# Patient Record
Sex: Female | Born: 1945
Health system: Southern US, Community
[De-identification: ages and names within clinical notes are randomized; demographics above are authoritative.]

---

## 2013-03-23 ENCOUNTER — Emergency Department: Payer: Self-pay | Admitting: Emergency Medicine

## 2013-03-31 ENCOUNTER — Inpatient Hospital Stay: Payer: Self-pay | Admitting: Vascular Surgery

## 2013-03-31 LAB — CBC
HCT: 40.2 % (ref 35.0–47.0)
HGB: 13.6 g/dL (ref 12.0–16.0)
MCH: 29.4 pg (ref 26.0–34.0)
MCHC: 33.9 g/dL (ref 32.0–36.0)
MCV: 87 fL (ref 80–100)
PLATELETS: 208 10*3/uL (ref 150–440)
RBC: 4.63 10*6/uL (ref 3.80–5.20)
RDW: 13.5 % (ref 11.5–14.5)
WBC: 10.1 10*3/uL (ref 3.6–11.0)

## 2013-03-31 LAB — COMPREHENSIVE METABOLIC PANEL
ALBUMIN: 3.5 g/dL (ref 3.4–5.0)
ALT: 20 U/L (ref 12–78)
Alkaline Phosphatase: 74 U/L
Anion Gap: 10 (ref 7–16)
BUN: 26 mg/dL — AB (ref 7–18)
Bilirubin,Total: 0.4 mg/dL (ref 0.2–1.0)
CALCIUM: 9.1 mg/dL (ref 8.5–10.1)
CHLORIDE: 100 mmol/L (ref 98–107)
CO2: 26 mmol/L (ref 21–32)
Creatinine: 1.45 mg/dL — ABNORMAL HIGH (ref 0.60–1.30)
EGFR (African American): 42 — ABNORMAL LOW
GFR CALC NON AF AMER: 37 — AB
Glucose: 61 mg/dL — ABNORMAL LOW (ref 65–99)
OSMOLALITY: 275 (ref 275–301)
Potassium: 2.8 mmol/L — ABNORMAL LOW (ref 3.5–5.1)
SGOT(AST): 23 U/L (ref 15–37)
SODIUM: 136 mmol/L (ref 136–145)
Total Protein: 7.4 g/dL (ref 6.4–8.2)

## 2013-03-31 LAB — FIBRINOGEN: Fibrinogen: 294 mg/dL (ref 210–470)

## 2013-03-31 LAB — APTT: ACTIVATED PTT: 27.1 s (ref 23.6–35.9)

## 2013-03-31 LAB — PROTIME-INR
INR: 0.9
PROTHROMBIN TIME: 12.3 s (ref 11.5–14.7)

## 2013-03-31 LAB — TROPONIN I

## 2013-04-01 LAB — BASIC METABOLIC PANEL
Anion Gap: 6 — ABNORMAL LOW (ref 7–16)
BUN: 21 mg/dL — ABNORMAL HIGH (ref 7–18)
CHLORIDE: 101 mmol/L (ref 98–107)
Calcium, Total: 8 mg/dL — ABNORMAL LOW (ref 8.5–10.1)
Co2: 27 mmol/L (ref 21–32)
Creatinine: 1.2 mg/dL (ref 0.60–1.30)
EGFR (African American): 53 — ABNORMAL LOW
GFR CALC NON AF AMER: 46 — AB
Glucose: 323 mg/dL — ABNORMAL HIGH (ref 65–99)
Osmolality: 284 (ref 275–301)
POTASSIUM: 3.3 mmol/L — AB (ref 3.5–5.1)
SODIUM: 134 mmol/L — AB (ref 136–145)

## 2013-04-01 LAB — CBC WITH DIFFERENTIAL/PLATELET
BASOS ABS: 0.1 10*3/uL (ref 0.0–0.1)
BASOS PCT: 0.8 %
BASOS PCT: 1 %
Basophil #: 0.1 10*3/uL (ref 0.0–0.1)
EOS PCT: 1.4 %
EOS PCT: 1.7 %
Eosinophil #: 0.1 10*3/uL (ref 0.0–0.7)
Eosinophil #: 0.2 10*3/uL (ref 0.0–0.7)
HCT: 38.1 % (ref 35.0–47.0)
HCT: 38.3 % (ref 35.0–47.0)
HGB: 13 g/dL (ref 12.0–16.0)
HGB: 13.1 g/dL (ref 12.0–16.0)
Lymphocyte #: 1.3 10*3/uL (ref 1.0–3.6)
Lymphocyte #: 1.7 10*3/uL (ref 1.0–3.6)
Lymphocyte %: 12.2 %
Lymphocyte %: 17.6 %
MCH: 29.8 pg (ref 26.0–34.0)
MCH: 30.1 pg (ref 26.0–34.0)
MCHC: 34.2 g/dL (ref 32.0–36.0)
MCHC: 34.2 g/dL (ref 32.0–36.0)
MCV: 87 fL (ref 80–100)
MCV: 88 fL (ref 80–100)
MONOS PCT: 9.8 %
Monocyte #: 0.9 x10 3/mm (ref 0.2–0.9)
Monocyte #: 1 x10 3/mm — ABNORMAL HIGH (ref 0.2–0.9)
Monocyte %: 9.7 %
NEUTROS ABS: 6.6 10*3/uL — AB (ref 1.4–6.5)
NEUTROS PCT: 69.9 %
NEUTROS PCT: 75.9 %
Neutrophil #: 8 10*3/uL — ABNORMAL HIGH (ref 1.4–6.5)
PLATELETS: 124 10*3/uL — AB (ref 150–440)
Platelet: 146 10*3/uL — ABNORMAL LOW (ref 150–440)
RBC: 4.33 10*6/uL (ref 3.80–5.20)
RBC: 4.4 10*6/uL (ref 3.80–5.20)
RDW: 13.2 % (ref 11.5–14.5)
RDW: 13.6 % (ref 11.5–14.5)
WBC: 10.5 10*3/uL (ref 3.6–11.0)
WBC: 9.4 10*3/uL (ref 3.6–11.0)

## 2013-04-01 LAB — APTT
ACTIVATED PTT: 55.6 s — AB (ref 23.6–35.9)
ACTIVATED PTT: 70.8 s — AB (ref 23.6–35.9)

## 2013-04-01 LAB — PROTIME-INR
INR: 1.3
Prothrombin Time: 15.8 secs — ABNORMAL HIGH (ref 11.5–14.7)

## 2013-04-02 LAB — APTT
Activated PTT: 52.8 secs — ABNORMAL HIGH (ref 23.6–35.9)
Activated PTT: 60.5 secs — ABNORMAL HIGH (ref 23.6–35.9)

## 2013-04-02 LAB — BASIC METABOLIC PANEL
Anion Gap: 6 — ABNORMAL LOW (ref 7–16)
BUN: 12 mg/dL (ref 7–18)
CHLORIDE: 103 mmol/L (ref 98–107)
CREATININE: 1.17 mg/dL (ref 0.60–1.30)
Calcium, Total: 7.6 mg/dL — ABNORMAL LOW (ref 8.5–10.1)
Co2: 28 mmol/L (ref 21–32)
EGFR (African American): 55 — ABNORMAL LOW
EGFR (Non-African Amer.): 48 — ABNORMAL LOW
Glucose: 212 mg/dL — ABNORMAL HIGH (ref 65–99)
OSMOLALITY: 280 (ref 275–301)
POTASSIUM: 3.7 mmol/L (ref 3.5–5.1)
Sodium: 137 mmol/L (ref 136–145)

## 2013-04-02 LAB — CBC WITH DIFFERENTIAL/PLATELET
Basophil #: 0.1 10*3/uL (ref 0.0–0.1)
Basophil %: 0.8 %
EOS ABS: 0.4 10*3/uL (ref 0.0–0.7)
Eosinophil %: 4.1 %
HCT: 35.3 % (ref 35.0–47.0)
HGB: 12 g/dL (ref 12.0–16.0)
LYMPHS ABS: 2 10*3/uL (ref 1.0–3.6)
LYMPHS PCT: 21.5 %
MCH: 29.7 pg (ref 26.0–34.0)
MCHC: 33.9 g/dL (ref 32.0–36.0)
MCV: 88 fL (ref 80–100)
Monocyte #: 1.1 x10 3/mm — ABNORMAL HIGH (ref 0.2–0.9)
Monocyte %: 12.2 %
NEUTROS ABS: 5.7 10*3/uL (ref 1.4–6.5)
Neutrophil %: 61.4 %
PLATELETS: 110 10*3/uL — AB (ref 150–440)
RBC: 4.03 10*6/uL (ref 3.80–5.20)
RDW: 13.6 % (ref 11.5–14.5)
WBC: 9.4 10*3/uL (ref 3.6–11.0)

## 2013-04-02 LAB — MAGNESIUM: Magnesium: 1.1 mg/dL — ABNORMAL LOW

## 2013-04-19 ENCOUNTER — Inpatient Hospital Stay: Payer: Self-pay | Admitting: Internal Medicine

## 2013-04-19 LAB — HEPATIC FUNCTION PANEL A (ARMC)
ALK PHOS: 77 U/L
ALT: 21 U/L (ref 12–78)
AST: 24 U/L (ref 15–37)
Albumin: 3.9 g/dL (ref 3.4–5.0)
BILIRUBIN TOTAL: 0.4 mg/dL (ref 0.2–1.0)
Bilirubin, Direct: <0.1 mg/dL (ref 0.00–0.20)
Total Protein: 7.6 g/dL (ref 6.4–8.2)

## 2013-04-19 LAB — BASIC METABOLIC PANEL
ANION GAP: 9 (ref 7–16)
BUN: 36 mg/dL — AB (ref 7–18)
CALCIUM: 9.1 mg/dL (ref 8.5–10.1)
CHLORIDE: 98 mmol/L (ref 98–107)
CO2: 25 mmol/L (ref 21–32)
CREATININE: 1.96 mg/dL — AB (ref 0.60–1.30)
EGFR (African American): 30 — ABNORMAL LOW
EGFR (Non-African Amer.): 25 — ABNORMAL LOW
GLUCOSE: 249 mg/dL — AB (ref 65–99)
OSMOLALITY: 281 (ref 275–301)
POTASSIUM: 3.6 mmol/L (ref 3.5–5.1)
Sodium: 132 mmol/L — ABNORMAL LOW (ref 136–145)

## 2013-04-19 LAB — CBC WITH DIFFERENTIAL/PLATELET
BASOS ABS: 0 10*3/uL (ref 0.0–0.1)
BASOS PCT: 0.5 %
EOS ABS: 0.3 10*3/uL (ref 0.0–0.7)
EOS PCT: 3.2 %
HCT: 41.7 % (ref 35.0–47.0)
HGB: 13.7 g/dL (ref 12.0–16.0)
Lymphocyte #: 2.9 10*3/uL (ref 1.0–3.6)
Lymphocyte %: 29.6 %
MCH: 29.1 pg (ref 26.0–34.0)
MCHC: 32.9 g/dL (ref 32.0–36.0)
MCV: 88 fL (ref 80–100)
MONO ABS: 0.8 x10 3/mm (ref 0.2–0.9)
Monocyte %: 8.6 %
NEUTROS ABS: 5.8 10*3/uL (ref 1.4–6.5)
NEUTROS PCT: 58.1 %
Platelet: 346 10*3/uL (ref 150–440)
RBC: 4.73 10*6/uL (ref 3.80–5.20)
RDW: 14.1 % (ref 11.5–14.5)
WBC: 9.9 10*3/uL (ref 3.6–11.0)

## 2013-04-19 LAB — TROPONIN I: Troponin-I: <0.02 ng/mL

## 2013-04-19 LAB — LIPASE, BLOOD: Lipase: 276 U/L (ref 73–393)

## 2013-04-20 LAB — CBC WITH DIFFERENTIAL/PLATELET
BASOS ABS: 0.2 10*3/uL — AB (ref 0.0–0.1)
Basophil %: 2.3 %
Eosinophil #: 0.3 10*3/uL (ref 0.0–0.7)
Eosinophil %: 5.1 %
HCT: 38.3 % (ref 35.0–47.0)
HGB: 12.5 g/dL (ref 12.0–16.0)
Lymphocyte #: 2.4 10*3/uL (ref 1.0–3.6)
Lymphocyte %: 34.8 %
MCH: 28.7 pg (ref 26.0–34.0)
MCHC: 32.7 g/dL (ref 32.0–36.0)
MCV: 88 fL (ref 80–100)
MONO ABS: 0.7 x10 3/mm (ref 0.2–0.9)
Monocyte %: 10.2 %
NEUTROS PCT: 47.6 %
Neutrophil #: 3.2 10*3/uL (ref 1.4–6.5)
Platelet: 285 10*3/uL (ref 150–440)
RBC: 4.36 10*6/uL (ref 3.80–5.20)
RDW: 13.9 % (ref 11.5–14.5)
WBC: 6.8 10*3/uL (ref 3.6–11.0)

## 2013-04-20 LAB — URINALYSIS, COMPLETE
BLOOD: NEGATIVE
Bilirubin,UR: NEGATIVE
Glucose,UR: NEGATIVE mg/dL (ref 0–75)
Hyaline Cast: 1
Ketone: NEGATIVE
Nitrite: NEGATIVE
PH: 5 (ref 4.5–8.0)
Protein: NEGATIVE
RBC,UR: 2 /HPF (ref 0–5)
Specific Gravity: 1.012 (ref 1.003–1.030)
Squamous Epithelial: 5

## 2013-04-20 LAB — WBCS, STOOL

## 2013-04-20 LAB — BASIC METABOLIC PANEL
ANION GAP: 8 (ref 7–16)
BUN: 31 mg/dL — ABNORMAL HIGH (ref 7–18)
CHLORIDE: 103 mmol/L (ref 98–107)
CREATININE: 1.5 mg/dL — AB (ref 0.60–1.30)
Calcium, Total: 8.7 mg/dL (ref 8.5–10.1)
Co2: 24 mmol/L (ref 21–32)
EGFR (African American): 41 — ABNORMAL LOW
EGFR (Non-African Amer.): 35 — ABNORMAL LOW
GLUCOSE: 163 mg/dL — AB (ref 65–99)
Osmolality: 280 (ref 275–301)
POTASSIUM: 3.2 mmol/L — AB (ref 3.5–5.1)
Sodium: 135 mmol/L — ABNORMAL LOW (ref 136–145)

## 2013-04-20 LAB — CLOSTRIDIUM DIFFICILE(ARMC)

## 2013-04-21 LAB — BASIC METABOLIC PANEL
Anion Gap: 4 — ABNORMAL LOW (ref 7–16)
BUN: 17 mg/dL (ref 7–18)
Calcium, Total: 8.9 mg/dL (ref 8.5–10.1)
Chloride: 108 mmol/L — ABNORMAL HIGH (ref 98–107)
Co2: 27 mmol/L (ref 21–32)
Creatinine: 1.05 mg/dL (ref 0.60–1.30)
EGFR (African American): >60
GFR CALC NON AF AMER: 54 — AB
Glucose: 160 mg/dL — ABNORMAL HIGH (ref 65–99)
Osmolality: 283 (ref 275–301)
POTASSIUM: 4.3 mmol/L (ref 3.5–5.1)
SODIUM: 139 mmol/L (ref 136–145)

## 2013-04-22 LAB — STOOL CULTURE

## 2013-05-26 ENCOUNTER — Ambulatory Visit: Payer: Self-pay | Admitting: Adult Health

## 2014-07-18 NOTE — Op Note (Signed)
PATIENT NAME:  Renee Woods, Renee Woods MR#:  [id number] DATE OF BIRTH:  1946/01/03  DATE OF PROCEDURE:  03/31/2013  PREOPERATIVE DIAGNOSES:  1.  Peripheral arterial disease with acute-on-chronic right lower extremity ischemia with rest pain.  2.  Occluded femoral distal bypass.  3.  Hypertension.  4.  Diabetes.  5.  Hyperlipidemia.   POSTOPERATIVE DIAGNOSES:  1.  Peripheral arterial disease with acute-on-chronic right lower extremity ischemia with rest pain.  2.  Occluded femoral distal bypass.  3.  Hypertension.  4.  Diabetes.  5.  Hyperlipidemia.   PROCEDURES:  1.  Ultrasound guidance for vascular access, left femoral artery.  2.  Catheter placement to right peroneal artery from left femoral approach.  3.  Aortogram and selective right lower extremity angiogram.  4.  Catheter-directed thrombolysis with 8 mg of tPA delivered with the AngioJet catheter in the right common femoral artery distal bypass and peroneal artery.  5.  Mechanical rheolytic thrombectomy, same vessels.  6.  Placement for continuous infusion of thrombolysis within the femoral distal bypass with tip terminating at the distal anastomosis in the peroneal artery.  7.  Right jugular triple-lumen catheter placement with ultrasound guidance.   SURGEON: Annice NeedyJason S Dew, M.D.   ANESTHESIA: Local with moderate conscious sedation.   ESTIMATED BLOOD LOSS: Approximately 25 mL.   INDICATION FOR PROCEDURE: A 69 year old white female presented today with acute ischemia of the right lower extremity with limb-threatening symptoms. She had an extensive history of vascular disease treated at an outside institution. We discussed with her options. She desired an attempt at limb salvage if possible. She is brought to the angio suite for further evaluation. The risks and benefits were discussed. Informed consent was obtained.   DESCRIPTION OF PROCEDURE: The patient is brought to the vascular interventional radiology suite. Groins were shaved and  prepped and a sterile surgical field was created. An ultrasound was used to access the left common femoral artery just at the top of a previously placed stent that went to the SFA and appeared to go into the common femoral artery. Pigtail catheter was placed in the aorta and a 5-French sheath was placed.   Aortogram showed patent aorta and iliac segments and patent renal arteries. I then crossed the aortic bifurcation and advanced to the right femoral head and selective right lower extremity angiogram was then performed.  This showed extensive stent placement in the native SFA which were occluded. The femoral distal bypass was occluded. The profunda femoris artery had mild, less than 50%, stenosis proximally. I was able to get a Terumo Advantage wire out to what appeared to be the bypass. I placed a 6-French Ansell sheath. The patient was given 3500 units of intravenous heparin. A Kumpe catheter was used to help traverse downstream  and I was able to gain access to what appeared to be the peroneal artery and the bypass appeared to terminate in the peroneal artery and its proximal and mid segment. There was thrombus within the peroneal artery as well.  The wire was then re-placed and 8 mg of tPA was instilled from the common femoral artery throughout the bypass graft and into the peroneal artery. This was allowed to dwell for 15 minutes. While this was dwelling, I turned my attention to the right jugular vein. The right jugular vein was accessed under direct ultrasound guidance without difficulty with a Seldinger needle. A J-wire was placed after skin nick and dilatation. The triple lumen catheter was placed over the wire and the wire  was removed. It was secured at about 18 cm at the skin with 3 silk sutures, and all 3 lumens withdrew dark red nonpulsatile blood and flushed easily with sterile saline.   I then turned my attention to the leg. The tPA had been dwelling for about 15 minutes when I resumed  treatment. Mechanical rheolytic thrombectomy was performed from the common femoral artery throughout the graft and down into the peroneal artery; nonetheless, the graft remained thrombosed.   At this point, I elected to place an infusion catheter for continuous thrombolysis. A 135 total length, 50 cm working length catheter was parked with its tip in the proximal bypass graft proximally and the distal tip at the distal anastomosis. At this point, I elected to terminate the procedure. The catheters were secured to the leg with 2 silk sutures and the patient was taken to the recovery room in stable condition having tolerated the procedure well.    ____________________________ Annice Needy, MD jsd:np D: 03/31/2013 17:33:25 ET T: 03/31/2013 19:38:25 ET JOB#: [id number]  cc: Annice Needy, MD, <Dictator> Annice Needy MD ELECTRONICALLY SIGNED 04/03/2013 13:37

## 2014-07-18 NOTE — Op Note (Signed)
PATIENT NAME:  Renee Woods, [name] MR#:  [id number] DATE OF BIRTH:  December 05, 1945  DATE OF PROCEDURE:  04/01/2013  PREOPERATIVE DIAGNOSES: 1.  Ischemia, right leg.  2. Complication of a vascular device with thrombosis of right femoral to peroneal prosthetic bypass graft.  3. Atherosclerotic occlusive disease, bilateral lower extremities, with rest pain, right lower extremity.   POSTOPERATIVE DIAGNOSES:  1.  Ischemia, right leg.  2. Complication of a vascular device with thrombosis of right femoral to peroneal prosthetic bypass graft.  3. Atherosclerotic occlusive disease, bilateral lower extremities, with rest pain, right lower extremity.   PROCEDURE PERFORMED: 1.  Followup angiography for infusion of tPA.  2.  Introduction catheter into peroneal, third order placement for selective angiography.  3.  Percutaneous transluminal angioplasty to 3 mm of the right peroneal artery.   SURGEON: Levora DredgeGregory Desmon Hitchner, M.D.   SEDATION: Versed 4 mg plus fentanyl 200 mcg administered IV. Continuous ECG, pulse oximetry and cardiopulmonary monitoring is performed throughout the entire procedure by the interventional radiology nurse. Total sedation time was 1 hour, 30 minutes.   ACCESS: Existing 6-French sheath, left common femoral artery.   FLUOROSCOPY TIME: 5-1/2 minutes.   CONTRAST USED: Isovue 45 mL.   INDICATIONS: Ms. Renee Woods is a 69 year old woman who presented to the ER yesterday with ischemia of her right leg. She is undergoing tPA infusion overnight for thrombolysis and is now returning to special procedures for followup for limb salvage. Risks and benefits were reviewed, and the patient has agreed to proceed.   DESCRIPTION OF PROCEDURE: The patient is taken to special procedures and placed in the supine position. After adequate sedation is achieved, the obturator wire is removed from the existing infusion catheter and hand injection of contrast is used to demonstrate there was now patency of the graft. There is  a narrowing of the peroneal at the level of the anastomosis. There is an occlusion of the peroneal in its midportion.   An 0.018 wire is then introduced through the lytic catheter, and the catheter is removed, and subsequently a 150 straight slip cath is advanced. Both lesions are crossed; 4000 units of heparin is given as the tPA has been turned off. A 3 x 15 balloon is then selected, advanced over the 0.018 wire, and angioplasty of the peroneal is performed to 10 atmospheres for 2 minutes. Followup angiography by direct injection through the catheter demonstrates the peroneal is widely patent. There is no evidence of thrombus. Distally, there is some disease, but there are 2 dominant collaterals, both of which appear to course to the lateral plantar. The detector is then repositioned to the groin, and runoff is obtained. There is very sluggish flow throughout the bypass graft, and there is poor filling of the peroneal from an injection at the level of the groin. It is, therefore, elected to initiate Integrilin and begin a heparin drip.   The sheath is then pulled into the left groin, oblique view is performed, and a StarClose device is deployed successfully. There are no immediate complications.   INTERPRETATION: Initial views demonstrate that the peroneal has 2 lesions within it. It is the only runoff to the foot at the ankle level. There is a large collateral and a smaller collateral, both of which fill the lateral plantar. There is not essentially no filling of the dorsalis pedis. The graft itself is now cleared of thrombus.   Following angioplasty, there is patency of the peroneal, however, as noted above, from the level of the groin, injection demonstrates  very poor filling.   SUMMARY: Successful thrombolysis of the right lower extremity bypass graft; however, the patient appears to have a very high resistive disadvantaged outflow and may not be able to support such a long prosthetic graft.  Hopefully, the combination of Integrilin and heparin will allow for some improvement in the outflow and stability of the situation.     ____________________________ [name], MD ggs:dmm D: 04/01/2013 11:38:34 ET T: 04/01/2013 11:58:05 ET JOB#: [id number]  cc: [name], MD, <Dictator> [name] MD ELECTRONICALLY SIGNED 04/01/2013 19:31

## 2014-07-18 NOTE — Discharge Summary (Signed)
PATIENT NAME:  Renee Woods, Shenika MR#:  [id number] DATE OF BIRTH:  03-Dec-1945  DATE OF ADMISSION:  03/31/2013 DATE OF DISCHARGE:  04/04/2013  ADMISSION DIAGNOSIS:  Acute ischemia, right lower extremity with occluded femoral-to-distal bypass graft and ischemic rest pain.   DISCHARGE DIAGNOSIS:  Acute ischemia, right lower extremity with occluded femoral-to-distal bypass graft and ischemic rest pain.   ADDITIONAL DIAGNOSES: Include:  1.  Atrial fibrillation.  2.  Coronary disease.  3.  Diabetes.   BRIEF HISTORY: This is a 69 year old female who was admitted with acute ischemia of the right lower extremity with a long history of vascular disease and occlusion of her femoral-to-distal bypass graft. She is brought in for an attempt at limb salvage. She is admitted knowing that limb loss is highly likely.   HOSPITAL COURSE: The patient was admitted, taken to the vascular interventional radiology suite where an angiogram with the initiation of thrombolysis was started. She had thrombolysis run overnight for approximately 24 hours. She was brought down the next morning where we were able to have her graft open. She had tibial intervention with poor runoff and she was kept on full anticoagulation. Over the next 2 to 3 days, her foot was warm. Her pain markedly improved and she was able to increase her activity and was ready to go home.   DISCHARGE MEDICATIONS:  She will be discharged home on Zofran as needed for nausea, potassium chloride 10 mEq b.i.d., NovoLog insulin sliding scale as well as Levemir 36 units b.i.d., HCTZ/lisinopril 25/20 daily, Lyrica 75 b.i.d., metformin 1000 b.i.d., metoprolol 50 daily, aspirin 81 daily, Lasix 40 mg daily, levothyroxine 50 mcg daily, lovastatin 20 mg daily, Pradaxa 150 daily, ranitidine 150 b.i.d. and Percocet as needed for pain.   DISCHARGE FOLLOWUP:  Her return appointment will be in 3 to 4 weeks in our office office. She will call or contact our office with any problems in  the interim.     ____________________________ Annice NeedyJason S. Dew, MD jsd:cs D: 04/23/2013 16:13:00 ET T: 04/23/2013 19:33:24 ET JOB#: [id number]  cc: Annice NeedyJason S. Dew, MD, <Dictator> Annice NeedyJASON S DEW MD ELECTRONICALLY SIGNED 04/29/2013 11:04

## 2014-07-18 NOTE — H&P (Signed)
PATIENT NAME:  Renee Woods, Renee Woods MR#:  [id number] DATE OF BIRTH:  May 08, 1945  DATE OF ADMISSION:  04/19/2013  PRIMARY CARE PHYSICIAN:  Nonlocal.   REFERRING PHYSICIAN:  Dr. Scotty Court.   CHIEF COMPLAINT:  Lightheadedness, diarrhea, nausea, vomiting.   HISTORY OF PRESENT ILLNESS:  Renee Woods is a 69 year old pleasant white female with a past medical history of hypertension, hyperlipidemia, diabetes mellitus, peripheral vascular disease who recently had an admission for ischemic right foot, underwent t-PA with improvement of the occlusion, was discharged on 04/04/2013.  During the hospital stay the patient started to have diarrhea.  Since the discharge the patient has been having multiple episodes of diarrhea about 10 times a day.  The patient started to experience severe dizziness.  Concerning this, the patient is brought to the Emergency Department.  The patient also states that has severe nausea, unable to eat or drink any fluids.  In the Emergency Department, the patient was noted to be hypotensive with a systolic blood pressure in the 70s.  The patient received IV fluids with improvement.  The patient is also noted to have acute renal failure.  The patient's baseline creatinine of 1.1, worsened to 1.9.  The patient was severely orthostatic.  Upon standing up the blood pressure dropped to 70s.  Denies having any fever, however has been experiencing some chills.  Denies having any abdominal pain.  The patient continues to take her blood pressure medication.  Stool for Clostridium difficile has been sent from the Emergency Department.  The patient does not have any sick contacts.  No recent antibiotic use.  PAST MEDICAL HISTORY: 1.  Hypertension.  2.  Hyperlipidemia.  3.  Diabetes mellitus, insulin-dependent.  4.  Peripheral vascular disease, status post toe amputation.  5.  Coronary artery disease, status post coronary artery bypass grafting.  6.  Congestive heart failure, systolic.  7.  Atrial  fibrillation.   PAST SURGICAL HISTORY: 1.  Cholecystectomy.  2.  Partial toe amputation. 3.  C-section.  4.  Coronary artery bypass grafting.  5.  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  6.  Cataract surgery.  7.  Multiple stent placement to the legs.   ALLERGIES:  PLAVIX AND BACTRIM.   HOME MEDICATIONS: 1.  Ranitidine 150 mg 2 times a day.  2.  Pradaxa 150 mg once a day.  3.  Potassium chloride 1 tablet 2 times a day.  4.  Zofran 4 mg every six hours as needed.  5.  NovoLog 5 units subcutaneous 3 times a day.  6.  Metoprolol 50 mg once a day.  7.  Metformin 1000 mg 1 tablet 2 times a day.  8.  Lyrica 75 mg 2 times a day.  9.  Lovastatin 20 mg once a day.  10.  Levothyroxine 50 mcg once a day.  11.  Levemir 36 units 2 times a day.  12.  Hydrochlorothiazide lisinopril 25/20 mg once a day.   13.  Lasix 40 mg once a day.  14.  Aspirin 81 mg once a day.  15.  Percocet 5/325 mg every four hours as needed.   SOCIAL HISTORY:  No history of smoking; however, has history of secondhand exposure.  Denies drinking alcohol or using illicit drugs.  Lives with her daughter.  Independent of ADLs.   FAMILY HISTORY:  Positive for coronary artery disease, diabetes mellitus in multiple family members.  Colon cancer in mother.   REVIEW OF SYSTEMS: CONSTITUTIONAL:  Severe generalized weakness.  EYES:  No change  in vision.  EARS, NOSE, THROAT:  No change in hearing.  RESPIRATORY:  No cough, shortness of breath.  CARDIOVASCULAR:  No chest pain, palpitations.  GASTROINTESTINAL:  Has nausea, vomiting and diarrhea.  GENITOURINARY:  No dysuria or hematuria.  Has decreased urine output in the last few days.   ENDOCRINE:  Has diagnosis of diabetes mellitus.  HEMATOLOGY:  No easy bruising or bleeding.  SKIN:  No rash or lesions.  MUSCULOSKELETAL:  Has osteoarthritis.  NEUROLOGIC:  The patient has peripheral neuropathy.  No weakness or numbness in any part of the body.   PHYSICAL  EXAMINATION: GENERAL:  This is a well-built, well-nourished, age-appropriate female lying down in the bed, not in distress.  VITAL SIGNS:  Temperature 97.6, pulse 90, blood pressure 112/46, respiratory rate of 16, oxygen saturation is 96% on room air.  HEENT:  Head normocephalic, atraumatic.  Eyes, no scleral icterus.  Conjunctivae normal.  Pupils equal and react to light.  Extraocular movements are intact.  Mucous membranes moist.  No pharyngeal erythema.  NECK:  Supple.  No lymphadenopathy.  No JVD.  No carotid bruit.  No thyromegaly.  CHEST:  Has no focal tenderness.  LUNGS:  Bilateral clear to auscultation.  HEART:  S1, S2 regular.  No murmurs are heard.  Tachycardia.  ABDOMEN:  Increased bowel sounds, mild discomfort.  No guarding or rebound tenderness.  EXTREMITIES:  No pedal edema.  Pulses 2+.  SKIN:  No rash or lesions.  MUSCULOSKELETAL:  Good range of motion in all the extremities.   NEUROLOGIC:  The patient is alert, oriented to place, person and time.  Cranial nerves II through XII intact.  Motor 5 by 5 in upper and lower extremities.   LABORATORY DATA:  CMP:  BUN 36, creatinine of 1.96.  The rest of all the values are within normal limits.   CBC is completely within normal limits.   Chest x-ray, one view portable:  No acute cardiopulmonary disease.   KUB, nonspecific bowel gas pattern.   ASSESSMENT AND PLAN:  Renee Woods is a 69 year old female with a history of multiple medical problems comes to the Emergency Department with diarrhea, hypotension, acute renal failure.  1.  Diarrhea.  The patient does not have any elevated white blood cell count.  The patient states that the diarrhea is a watery diarrhea.  Is not on any medication which predisposes her to have the diarrhea.  We will send stool for Clostridium difficile toxin.  If negative, we will give Imodium.  The patient, as mentioned above, does not have any obvious tenderness or elevated white blood cell count.  2.  Diabetes  mellitus.  We will decrease the dose of the Levemir to 20 units twice daily.  3.  Hypotension secondary to severe dehydration.  Continue with IV fluids.  4.  Acute renal failure.  This is prerenal as well as possibly acute tubular necrosis secondary to hypotension.  Continue with IV fluids and follow up.  5.  Hypertension.  We will hold all blood pressure medications for now.  6.  Debility.  We will involve the physical therapy.  7.  The patient is on Pradaxa which should cover for the deep vein thrombosis prophylaxis as well.   TIME SPENT:  50 minutes.    ____________________________ Susa GriffinsPadmaja Yonathan Perrow, MD pv:ea D: 04/20/2013 01:00:06 ET T: 04/20/2013 02:24:07 ET JOB#: [id number]  cc: Susa GriffinsPadmaja Markeese Boyajian, MD, <Dictator> Susa GriffinsPADMAJA Elaisha Zahniser MD ELECTRONICALLY SIGNED 05/03/2013 20:43

## 2014-07-18 NOTE — Discharge Summary (Signed)
PATIENT NAME:  Renee Woods, Renee Woods MR#:  [id number] DATE OF BIRTH:  1946-01-08  DATE OF ADMISSION:  04/19/2013 DATE OF DISCHARGE:  04/21/2013  DISCHARGE DIAGNOSES:  1. Viral  gastroenteritis.  2. Hypotension secondary to severe dehydration.  3. Acute renal failure.  4. Peripheral arterial disease.  5. Insulin-dependent diabetes mellitus, on metformin and Lantus.  6. Chronic atrial fibrillation, rate controlled.  7. Diabetic neuropathy.  8. Hypothyroidism.   IMAGING STUDIES: Include abdominal and chest x-ray which showed no acute abnormalities.   ADMITTING HISTORY AND PHYSICAL AND HOSPITAL COURSE: Please see detailed H and P dictated by Dr. Heron NayVasireddy. In brief, a 69 year old patient with history of hypertension, hyperlipidemia, diabetes, with recent hospital stay for ischemic right leg, presented to the hospital with un-resolving diarrhea of 10 days. The patient mentions that she had mild diarrhea when leaving the hospital, which worsened. She presented feeling lightheaded, dizzy hypotensive, with her systolic into the 70s. The patient did improve well with starting on IV fluids. During the 24 hours of the hospital stay, the patient did have 1 episode of watery diarrhea after she came onto the floor, but nothing after that. The patient has tolerated her food well. No abdominal pain. Acute renal failure has resolved, and creatinine is back to baseline. Blood pressure is into the 140s systolic. The patient feels well and has requested to be discharged home. We did check C. difficile and stool WBC, which are negative. Secondary to acute renal failure, the patient's metformin was held during the hospital stay.   I have discussed with the patient regarding her care and plan to continue to monitor at home. The patient needs to keep herself hydrated. I have advised her to hold metformin if she has any further diarrhea, which could be causing the diarrhea. If this does not improve, the patient needs to follow up  with GI for a colonoscopy.   Prior to discharge, the patient's examination showed no abdominal tenderness. Normal bowel sounds.   DISCHARGE MEDICATIONS: Include:  1. Zofran 4 mg oral every 6 hours as needed.  2. Potassium chloride 10 mEq oral 2 times a day.  3. NovoLog 5 units subcutaneous 3 times a day with meals.  4. Levemir 36 units subcutaneous 2 times a day.  5. Hydrochlorothiazide/lisinopril 25/20 one tablet oral once a day.  6. Lyrica 75 mg daily.  7. Metformin 1000 mg 2 times a day.  8. Metoprolol 50 mg oral once a day.  9. Aspirin 81 mg daily.  10. Levothyroxine 50 mcg daily.  11. Lovastatin 20 mg daily.  12. Pradaxa 150 mg oral once a day.  13. Triamterene 150 mg oral 2 times a day.  14. Acetaminophen/oxycodone 325/5 one tablet oral every 4 hours as needed.   DISCHARGE INSTRUCTIONS: Carbohydrate-controlled low-salt diet. Activity as tolerated. Follow up with primary care physician in 1 to 2 weeks. The patient needs to hold her metformin if her diarrhea recurs.   TIME SPENT ON DAY OF DISCHARGE IN DISCHARGE ACTIVITY: 35 minutes.  ____________________________ Molinda BailiffSrikar R. Benjamen Koelling, MD srs:lb D: 04/23/2013 12:34:00 ET T: 04/23/2013 13:49:03 ET JOB#: [id number]  cc: Wardell HeathSrikar R. Laiylah Roettger, MD, <Dictator> [facility] Orie FishermanSRIKAR R Ford Peddie MD ELECTRONICALLY SIGNED 04/23/2013 16:08

## 2014-07-18 NOTE — Consult Note (Signed)
PATIENT NAME:  Renee Woods, Renee Woods MR#:  [id number] DATE OF BIRTH:  [birth year]  DATE OF CONSULTATION:  04/01/2013  REFERRING PHYSICIAN:  Levora DredgeGregory Schnier, MD CONSULTING PHYSICIAN:  Hope PigeonVaibhavkumar G. Elisabeth PigeonVachhani, MD  REASON FOR CONSULTATION: Medical management.   HISTORY OF PRESENT ILLNESS: The patient is a 69 year old female who has history of peripheral vascular disease, coronary artery disease status post CABG, CHF, diabetes and toe amputations in the past came to the hospital because of severe pain and numbness in right leg and found having decreased circulation. She was taken for vascular surgery urgently and now status post surgery medical consult for medical management. She is complaining of severe pain in her right leg. Denies any other complaints.   REVIEW OF SYSTEMS: CONSTITUTIONAL: Negative for fever, fatigue, weakness, pain or weight loss.  EYES: No blurring, double vision, discharge or redness.  EARS, NOSE, THROAT: No tinnitus, ear pain or hearing loss.  RESPIRATORY: No cough, wheezing, hemoptysis or shortness of breath.  CARDIOVASCULAR: No chest pain, orthopnea, edema or palpitations.  GASTROINTESTINAL: No nausea, vomiting, diarrhea, abdominal pain.  GENITOURINARY: No dysuria, hematuria or increased frequency.  ENDOCRINE: No increased sweating. No heat or cold intolerance.  SKIN: No acne, rashes or lesions.  MUSCULOSKELETAL: No pain or swelling in the joints, except her right leg pain which is severe status post surgery.  NEUROLOGICAL: No numbness, weakness, tremor or vertigo.  PSYCHIATRIC: No anxiety, insomnia or bipolar disorder.   PAST MEDICAL HISTORY: 1.  Atrial fibrillation.  2.  CHF. 3.  Coronary artery disease.  4.  Diabetes.  5.  Partial toe amputation.  6.  Cesarean section.  7.  Cholecystectomy.  8.  Artificial artery replacement surgery in the leg.  9.  Coronary artery bypass graft.   PAST SURGICAL HISTORY:  1.  CABG.  2.  Gallbladder surgery.  3.  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  4.  Toe amputation on the toes.  5.  Cataract surgery.  6.  Arterial surgery on the leg and then stents multiple times.   SOCIAL HISTORY: No alcohol. Denies smoking. No illegal drug use. Lives with daughter and is able to walk without any support until this problem started and was living active life.   FAMILY HISTORY: Positive for coronary artery disease and diabetes in multiple family members and colon cancer in her mother.   HOME MEDICATIONS:  1.  Ranitidine 150 mg 2 times a day.  2.  Pradaxa 150 mg once a day.  3.  Potassium chloride 1 capsule 2 times a day.  4.  Ondansetron 4 mg oral every 6 hours.  5.  NovoLog 5 units subcutaneous injection 3 times a day.  6.  Metoprolol extended release 50 mg oral tablet once a day.  7.  Metformin 1000 mg oral tablet 2 times a day.  8.  Lyrica 75 mg oral capsule 2 times a day.  9.  Lovastatin 20 mg oral once a day.  10.  Levothyroxine 50 mcg oral once a day.  11.  Levemir 36 units subcutaneous 2 times a day.  12.  Hydrochlorothiazide/lisinopril 25/20 mg tablet once a day.  13.  Furosemide 40 mg oral once a day.  14.  Aspirin enteric-coated 81 mg once a day.   PHYSICAL EXAMINATION: VITAL SIGNS: In the ER, temperature 97.9, pulse 80, respirations 16, blood pressure 134/57, pulse oximetry on oxygen 2 liters with 98% saturation.  GENERAL: The patient is fully alert and in distress due to pain in the right leg.  She is oriented to time, place, and person and cooperative with history taking and physical examination. Obese female status post surgery. HEENT: Head and neck atraumatic. Conjunctivae pink. Oral mucosa moist.  NECK: Supple. There is IJ line present in right side of the neck. RESPIRATORY: Bilateral equal and clear. CARDIOVASCULAR: S1 and S2 present, regular. No murmur.  ABDOMEN: Soft, nontender. Bowel sounds present. No organomegaly.  SKIN: No rashes.  LEGS: No edema. Slightly cold to feel on the right lower  leg. Pulsation very weak. NEUROLOGICAL: Power 4/5. Moves all 4 limbs. Generalized weakness. No tremor or rigidity.   IMPORTANT LABORATORY AND DIAGNOSTIC RESULTS: Blood glucose 369. BUN 21, creatinine 1.2, potassium on presentation 2.8 and now 3.3, chloride 100, CO2 27 and calcium is 8.1. WBC 10.5, hemoglobin 13, platelet count 124 now. INR is 1.3. APTT is 52.6.   Ultrasound color Doppler of right leg shows no evidence of deep vein thrombosis, right lower extremity arterial stent or graft occlusion.   Chest x-ray, portable, to confirm the position of right IJ is satisfactory.  ASSESSMENT AND PLAN: A 69 year old female with peripheral vascular disease history found having acute blockage of her arterial graft and undertaken for emergency vascular surgery, status post surgery now. Medical consult for medical management.  1.  Peripheral vascular disease, status post surgery. Currently on heparin drip. Management as per the vascular team. She is on morphine PCA drip because of pain injections as needed. Will continue monitoring her CBC because of drop in the platelet while she is on heparin drip.   2.  Atrial fibrillation. She was taking Pradaxa at home and aspirin. Currently, because of IV heparin drip, will hold Pradaxa and will continue monitoring her in critical care unit.  3.  Coronary artery disease, status post CABG. Continue metoprolol and is statin and is on heparin drip so we will hold antiplatelet agents or antithrombotic at this time.   4.  Congestive heart failure. We do not have any previous echocardiogram in the system for check-up, but she was taking her Lasix every day. Will currently keep it on hold because of surgical active issues and blood pressure is stable. The patient is not hypoxic. 5.  Diabetes. The patient had an episode of hypoglycemia yesterday in the hospital. She was taking Levemir 36 units subcutaneous 2 times a day. We will give her low-dose in the hospital as now her blood  sugar is running high side and we will keep her on insulin sliding coverage. Would like to avoid oral medication at this time because of postoperative status.  6.  Hypothyroidism. Continue levothyroxine.   CODE STATUS: FULL code.   We will continue following for medical issues.   TOTAL TIME SPENT ON THIS CONSULTATION: 55 minutes.    ____________________________ Hope Pigeon Elisabeth Pigeon, MD vgv:[id number] D: 04/01/2013 15:18:37 ET T: 04/01/2013 16:31:19 ET JOB#: [id number]  cc: Hope Pigeon. Elisabeth Pigeon, MD, <Dictator> Altamese Dilling MD ELECTRONICALLY SIGNED 04/02/2013 22:23

## 2014-07-18 NOTE — Consult Note (Signed)
Brief Consult Note: Diagnosis: PVD, medical management of other issues.   Patient was seen by consultant.   Consult note dictated.   Orders entered.   Comments: will manage other medical issues.  Electronic Signatures: Altamese DillingVachhani, Lilianah Buffin (MD)  (Signed 06-Jan-15 13:18)  Authored: Brief Consult Note   Last Updated: 06-Jan-15 13:18 by Altamese DillingVachhani, Kashayla Ungerer (MD)

## 2014-07-18 NOTE — Consult Note (Signed)
CHIEF COMPLAINT and HISTORY:  Subjective/Chief Complaint right foot pain   History of Present Illness Patient presents with about a 6-8 hour history of right leg pain.  7-8 out of 10, crampy in nature.  Like it is on the inside is how she describes it.  No ulcers or infection.  No antecedent history recently.  Had extensive right leg bypass performed prior to moving to [place] for what sounds like rest pain and toe ulceration. Digital amputations at that time.  She knew at that time she might lose her leg.  Sounds like a prosthetic bypass was used and the incision looks like a fem-distal bypass.  Nothing alleviating the pain.  Pain largely in the lower leg and foot on the right.  No left leg symptoms.  No real aggrevating symptoms at this time.  Last ate breakfast this morning about 7-8 hours ago.  Was on Pradaxa.   PAST MEDICAL/SURGICAL HISTORY:  Past Medical History:   Atrial Fibrillation:    CHF:    CAD:    Diabetes:    Partial Toe Amputation:    Cesarean Section:    Cholecystectomy:    Artery replaced in leg:    CABG (Coronary Artery Bypass Graft):   ALLERGIES:  Allergies:  Plavix: Hives  Bactrim: Other  HOME MEDICATIONS:  Home Medications: Medication Instructions Status  Keflex 500 mg oral capsule 1 cap(s) orally 4 times a day Active  ondansetron 4 mg oral tablet, disintegrating 1 tab(s) orally every 6 hours Active  potassium chloride extended release 10 mEq oral capsule, extended release 1 cap(s) orally 2 times a day Active  NovoLOG 100 units/mL subcutaneous solution 5 unit(s) subcutaneous 3 times a day Active  Levemir FlexTouch 100 units/mL subcutaneous solution 36 unit(s) subcutaneous 2 times a day Active  hydrochlorothiazide-lisinopril 25 mg-20 mg oral tablet 1 tab(s) orally once a day Active  Lyrica 75 mg oral capsule 1 cap(s) orally 2 times a day Active  metFORMIN 1000 mg oral tablet 1 tab(s) orally 2 times a day Active  metoprolol extended release 50 mg oral tablet,  extended release 1 tab(s) orally once a day Active  Aspirin Enteric Coated 81 mg oral delayed release tablet 1 tab(s) orally once a day Active  furosemide 40 mg oral tablet 1 tab(s) orally once a day Active  levothyroxine 50 mcg (0.05 mg) oral tablet 1 tab(s) orally once a day Active  lovastatin 20 mg oral tablet 1 tab(s) orally once a day Active  Pradaxa 150 mg oral capsule 1 cap(s) orally once a day Active  ranitidine 150 mg oral tablet 1 tab(s) orally 2 times a day Active   Family and Social History:  Family History Coronary Artery Disease  Hypertension   Social History positive  tobacco (Current within 1 year), negative ETOH   Place of Living Home   Review of Systems:  Subjective/Chief Complaint right leg pain and numbness.  Also weak in right lower leg.  No ulcers or skin changes.   Fever/Chills No   Cough No   Sputum No   Abdominal Pain No   Diarrhea No   Constipation No   Nausea/Vomiting No   SOB/DOE No   Chest Pain No   Telemetry Reviewed Afib   Dysuria No   Tolerating PT Yes   Tolerating Diet Yes   Medications/Allergies Reviewed Medications/Allergies reviewed   Physical Exam:  GEN well developed, well nourished   HEENT pink conjunctivae, hearing intact to voice, moist oral mucosa   NECK No masses  trachea midline   RESP normal resp effort  no use of accessory muscles   CARD irregular rate  no JVD   VASCULAR ACCESS none   ABD denies tenderness  soft  normal BS   GU foley catheter in place  clear yellow urine draining   LYMPH negative neck, negative axillae   EXTR right leg is cool to touch from mid calf down, no palpable or dopplerable pulses on right.  Left DP 2+.   SKIN normal to palpation, skin turgor good   NEURO cranial nerves intact, motor/sensory function intact, right leg is weak, but can move and feel foot   PSYCH alert, A+O to time, place, person   LABS:  Laboratory Results: LabObservation:    05-Jan-15 14:47, [place] Color  Flow Doppler Lower Extrem Right (Leg)  OBSERVATION   Reason for Test History of DVT, calf pain  Hepatic:    05-Jan-15 14:53, Comprehensive Metabolic Panel  Bilirubin, Total 0.4  Alkaline Phosphatase 74  45-117  NOTE: New Reference Range  02/14/13  SGPT (ALT) 20  SGOT (AST) 23  Total Protein, Serum 7.4  Albumin, Serum 3.5  Routine Chem:  Glucose, Serum 61  BUN 26  Creatinine (comp) 1.45  Sodium, Serum 136  Potassium, Serum 2.8  Chloride, Serum 100  CO2, Serum 26  Calcium (Total), Serum 9.1  Osmolality (calc) 275  eGFR (African American) 42  eGFR (Non-African American) 37  eGFR values <39m/min/1.73 m2 may be an indication of chronic  kidney disease (CKD).  Calculated eGFR is useful in patients with stable renal function.  The eGFR calculation will not be reliable in acutely ill patients  when serum creatinine is changing rapidly. It is not useful in   patients on dialysis. The eGFR calculation may not be applicable  to patients at the low and high extremes of body sizes, pregnant  women, and vegetarians.  Anion Gap 10  Cardiac:    05-Jan-15 14:53, Troponin I  Troponin I < 0.02  0.00-0.05  0.05 ng/mL or less: NEGATIVE   Repeat testing in 3-6 hrs   if clinically indicated.  >0.05 ng/mL: POTENTIAL   MYOCARDIAL INJURY. Repeat   testing in 3-6 hrs if   clinically indicated.  NOTE: An increase or decrease   of 30% or more on serial   testing suggests a   clinically important change  Routine Coag:    05-Jan-15 14:53, Activated PTT  Activated PTT (APTT) 27.1  A HCT value >55% may artifactually increase the APTT. In one study,  the increase was an average of 19%.  Reference: "Effect on Routine and Special Coagulation Testing Values  of Citrate Anticoagulant Adjustment in Patients with High HCT Values."  American Journal of Clinical Pathology 2006;126:400-405.    05-Jan-15 14:53, Prothrombin Time  Prothrombin 12.3  INR 0.9  INR reference interval applies to patients  on anticoagulant therapy.  A single INR therapeutic range for coumarins is not optimal for all  indications; however, the suggested range for most indications is  2.0 - 3.0.  Exceptions to the INR Reference Range may include: Prosthetic heart  valves, acute myocardial infarction, prevention of myocardial  infarction, and combinations of aspirin and anticoagulant. The need  for a higher or lower target INR must be assessed individually.  Reference: The Pharmacology and Management of the Vitamin K   antagonists: the seventh ACCP Conference on Antithrombotic and  Thrombolytic Therapy. CIZTIW.5809Sept:126 (3suppl): [id number]  A HCT value >55% may artifactually increase the PT.  In one  study,   the increase was an average of 25%.  Reference:  "Effect on Routine and Special Coagulation Testing Values  of Citrate Anticoagulant Adjustment in Patients with High HCT Values."  American Journal of Clinical Pathology 2006;126:400-405.  Routine Hem:    05-Jan-15 14:53, Hemogram, Platelet Count  WBC (CBC) 10.1  RBC (CBC) 4.63  Hemoglobin (CBC) 13.6  Hematocrit (CBC) 40.2  Platelet Count (CBC) 208  Result(s) reported on 31 Mar 2013 at 03:18PM.  MCV 87  MCH 29.4  MCHC 33.9  RDW 13.5   RADIOLOGY:  Radiology Results: [place]:    05-Jan-15 14:47, [place] Color Flow Doppler Lower Extrem Right (Leg)  [place] Color Flow Doppler Lower Extrem Right (Leg)  REASON FOR EXAM:     History of DVT, calf pain  COMMENTS:   LMP: Post-Menopausal    PROCEDURE: [place]  - US DOPPLER LOW EXTR RIGHT  - Mar 31 2013  2:47PM     CLINICAL DATA:  Calf pain.  History DVT.    EXAM:  Bilateral LOWER EXTREMITY VENOUS DOPPLER ULTRASOUND    TECHNIQUE:  Gray-scale sonography with graded compression, as well as color  Doppler and duplex ultrasound, were performed to evaluate the deep  venous system from the level of the common femoral vein through the  popliteal and proximal calfveins. Spectral Doppler was utilized to  evaluate flow at  rest and with distal augmentation maneuvers.    COMPARISON:  None.    FINDINGS:  Thrombus within deep veins:  None visualized.    Compressibility of deep veins:  Normal.    Duplex waveform respiratory phasicity:  Normal.    Duplex waveform response to augmentation:  Normal.    Venous reflux:  None visualized.  Other findings:  None visualized.    Right lower extremity arterial stent/graft occlusion.     IMPRESSION:  1.  No evidence of deepvenous thrombosis.    2.  Right lower extremity arterial stent/ graft occlusion.      Electronically Signed    By: Marcello Moores  Register    On: 03/31/2013 14:52       Verified By: Osa Craver, M.D., MD  LabUnknown:    28-Dec-14 12:19, CT Head Without Contrast  PACS Image    05-Jan-15 14:47, [place] Color Flow Doppler Lower Extrem Right (Leg)  PACS Image  CT:    28-Dec-14 12:19, CT Head Without Contrast  CT Head Without Contrast  REASON FOR EXAM:    Head trauma with vomiting  COMMENTS:       PROCEDURE: CT  - CT HEAD WITHOUT CONTRAST  - Mar 23 2013 12:19PM     CLINICAL DATA:  Hit head on cabinet bore several days ago. Head  laceration. Vomiting times to. Blurred vision.    EXAM:  CT HEAD WITHOUT CONTRAST    TECHNIQUE:  Contiguous axial images were obtained from the base of the skull  through the vertex without intravenous contrast.  COMPARISON:  None.    FINDINGS:  The ventricles are normal in configuration. There is ventricular and  sulcal enlargement reflecting mild atrophy. There are no parenchymal  masses or mass effect. Mild patchy white matter hypoattenuation is  noted most consistent with chronic microvascular ischemic change.  There is no evidence of a corticalinfarct.    There are no extra-axial masses or abnormal fluid collections.    There is no intracranial hemorrhage.    There is a left frontal region area of increased attenuation  consistent with the lacerations/scalp hemorrhage. There  is no  skull  fracture.    Mild mucosal thickening lines the maxillary, ethmoid and anterior  sphenoid sinuses.     IMPRESSION:  1. No acute intracranial abnormalities.  No skull fracture.      Electronically Signed    By: Lajean Manes M.D.    On: 03/23/2013 12:23         Verified By: Lasandra Beech, M.D.,   ASSESSMENT AND PLAN:  Assessment/Admission Diagnosis acute ischemia of RLE with long history of vascular disease and previous leg bypass CAD HTN DM   Plan This is a patient with multiple medical issues and has an acutely ischemic RLE.  This is a clear limb threatening issue and without revascularization she would very likely lose the leg.  Discussed with pateint and family that we may not be able to restore flow to the leg, and this would likely result in amputation.  Even if we restore flow, she is at high risk of recurrent thrombosis particularly with a prosthetic graft.  Patient voices understanding, and desires to proceed with an attempt at opening bypass to try to save her leg.      level 5   Electronic Signatures: Algernon Huxley (MD)  (Signed 05-Jan-15 16:02)  Authored: Chief Complaint and History, PAST MEDICAL/SURGICAL HISTORY, ALLERGIES, HOME MEDICATIONS, Family and Social History, Review of Systems, Physical Exam, LABS, RADIOLOGY, Assessment and Plan   Last Updated: 05-Jan-15 16:02 by Algernon Huxley (MD)

## 2015-11-21 IMAGING — CR DG CHEST 2V
1 series · 2 of 2 positions shown · non-contrast
Comparison: 04/01/2013

CLINICAL DATA: Decreased appetite.  Loose stool for 10 days

EXAM:
CHEST  2 VIEW

[Series 1: pa · 0.17mm/px · 2 of 2 slices shown]
[im 1/2]
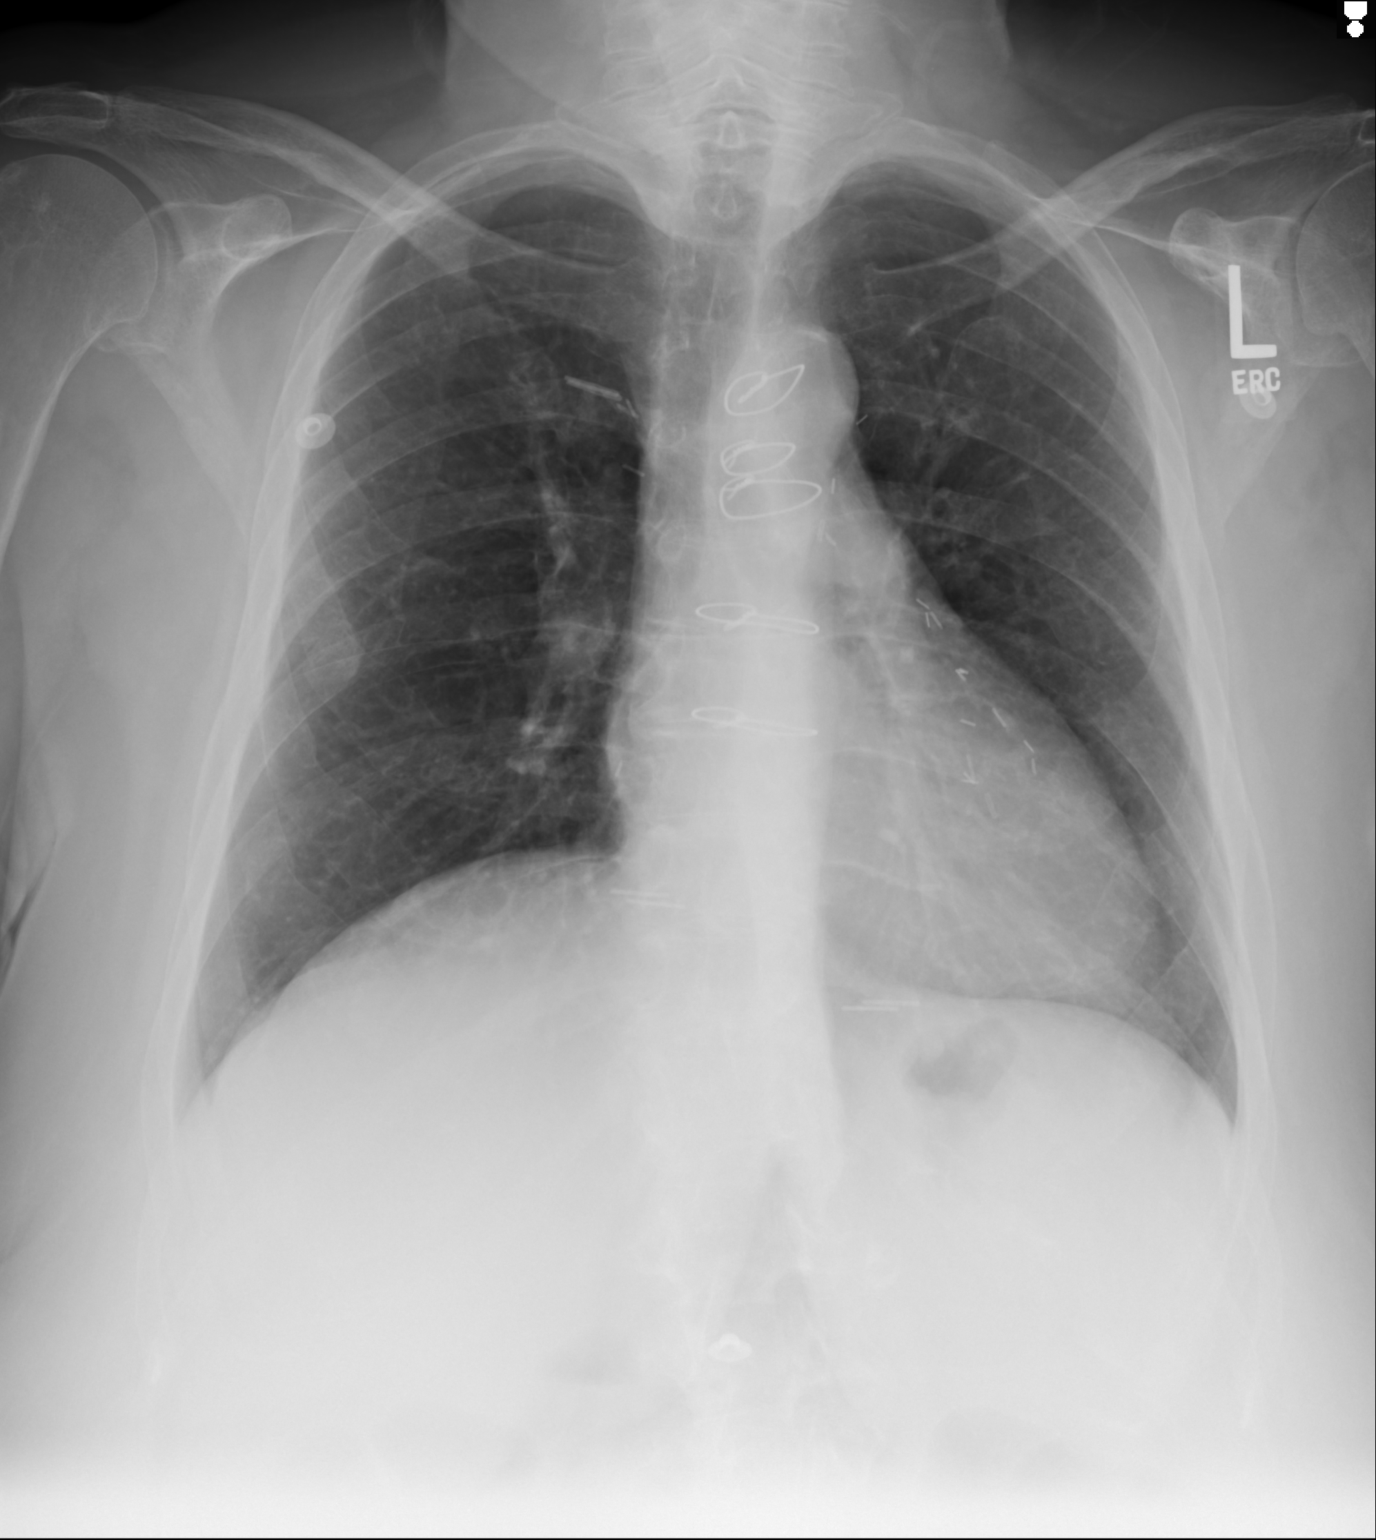
[im 2/2]
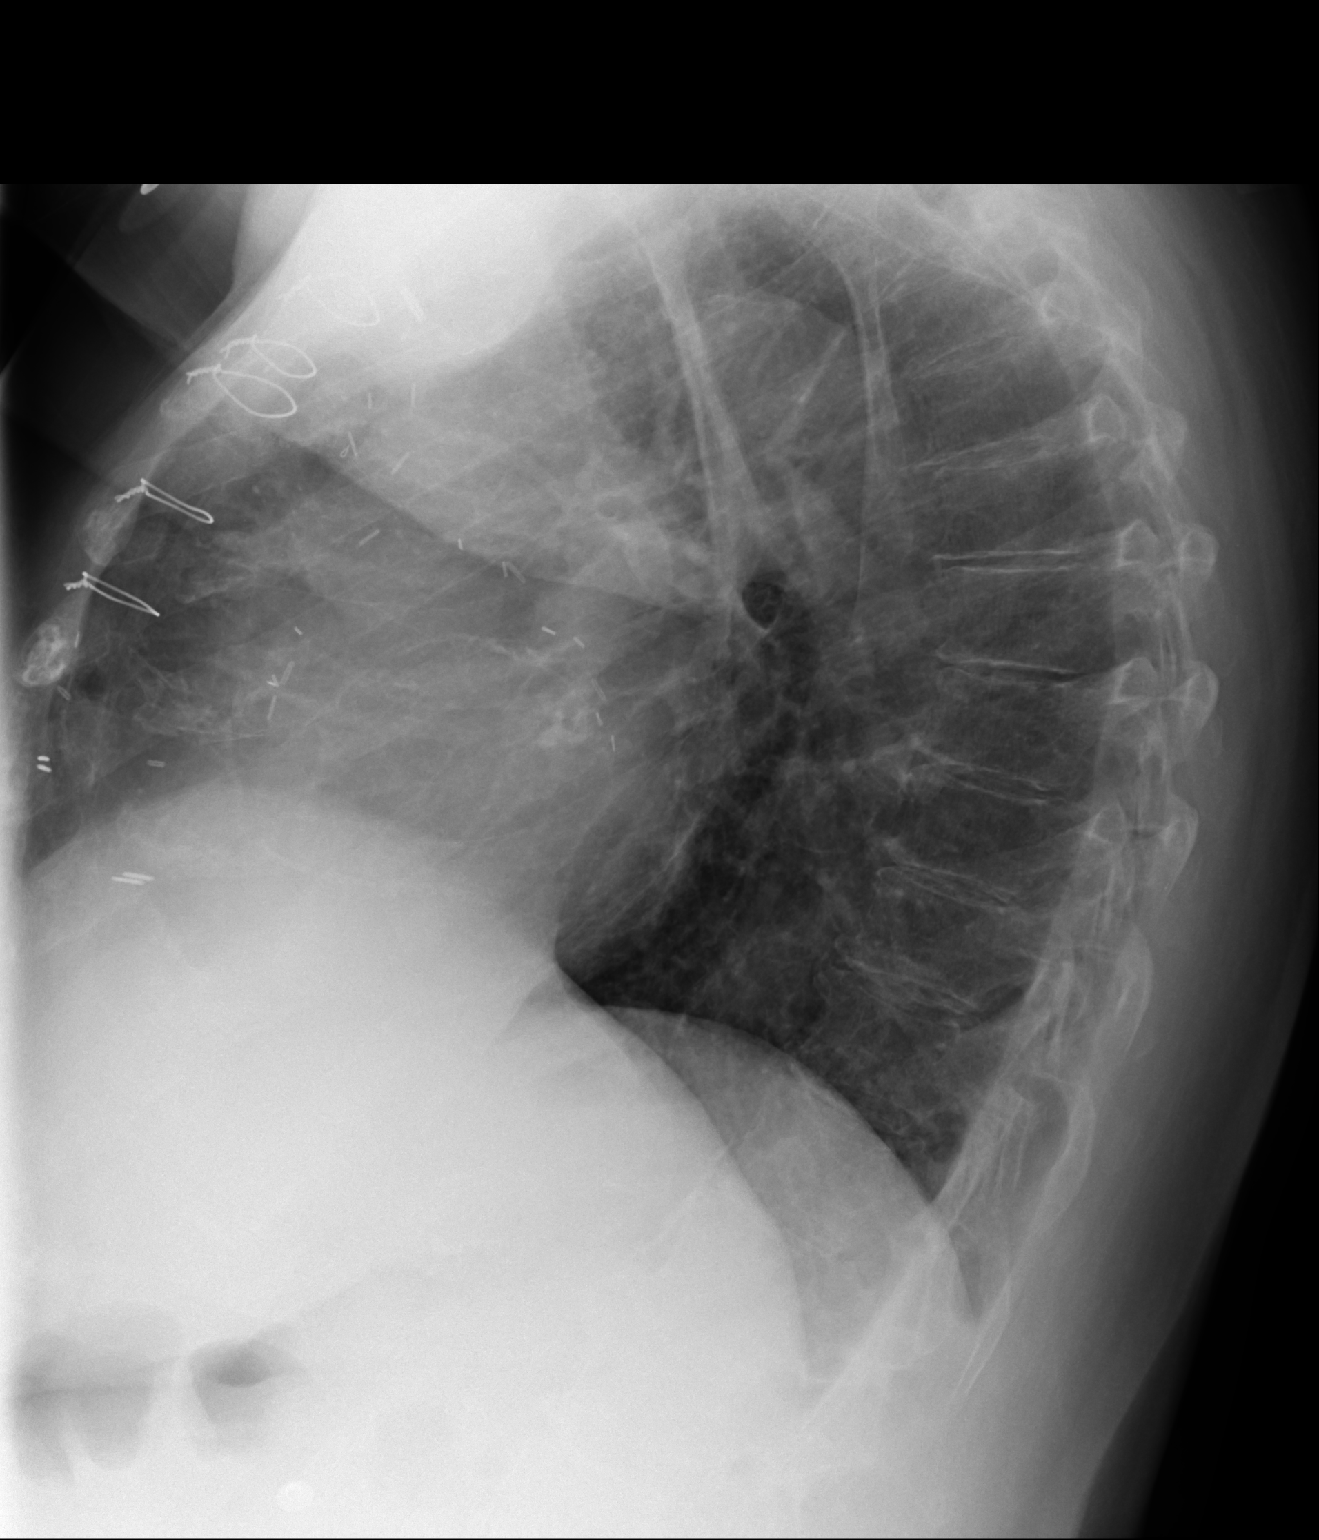

[2 of 2 positions shown; findings below may reference images not displayed]

FINDINGS: Previous median sternotomy and CABG procedure. Normal heart size. No
pleural effusion or edema. No airspace consolidation identified.
Bones are osteopenic and there is mild spondylosis within the
thoracic spine.
IMPRESSION: 1. No acute cardiopulmonary abnormalities.

## 2017-02-24 DEATH — deceased
